# Patient Record
Sex: Male | Born: 1955 | Race: White | Hispanic: No | Marital: Married | Smoking: Former smoker
Health system: Southern US, Community
[De-identification: ages and names within clinical notes are randomized; demographics above are authoritative.]

## PROBLEM LIST (undated history)

## (undated) DIAGNOSIS — N183 Chronic kidney disease, stage 3 unspecified: Secondary | ICD-10-CM

## (undated) DIAGNOSIS — C329 Malignant neoplasm of larynx, unspecified: Secondary | ICD-10-CM

## (undated) DIAGNOSIS — I10 Essential (primary) hypertension: Secondary | ICD-10-CM

## (undated) DIAGNOSIS — F0781 Postconcussional syndrome: Secondary | ICD-10-CM

## (undated) DIAGNOSIS — G4733 Obstructive sleep apnea (adult) (pediatric): Secondary | ICD-10-CM

## (undated) DIAGNOSIS — E039 Hypothyroidism, unspecified: Secondary | ICD-10-CM

## (undated) DIAGNOSIS — Z9989 Dependence on other enabling machines and devices: Secondary | ICD-10-CM

## (undated) HISTORY — PX: TONSILLECTOMY: SUR1361

---

## 2006-02-19 HISTORY — PX: LARYNX SURGERY: SHX692

## 2006-07-08 ENCOUNTER — Emergency Department (HOSPITAL_COMMUNITY): Admission: EM | Admit: 2006-07-08 | Discharge: 2006-07-08 | Payer: Self-pay | Admitting: Emergency Medicine

## 2014-07-29 ENCOUNTER — Inpatient Hospital Stay (HOSPITAL_COMMUNITY)
Admission: EM | Admit: 2014-07-29 | Discharge: 2014-08-09 | DRG: 102 | Disposition: A | Discharge status: Home Health | Payer: BLUE CROSS/BLUE SHIELD | Attending: Family Medicine | Admitting: Family Medicine

## 2014-07-29 ENCOUNTER — Emergency Department (HOSPITAL_COMMUNITY): Payer: BLUE CROSS/BLUE SHIELD

## 2014-07-29 ENCOUNTER — Encounter (HOSPITAL_COMMUNITY): Payer: Self-pay | Admitting: Emergency Medicine

## 2014-07-29 DIAGNOSIS — N183 Chronic kidney disease, stage 3 unspecified: Secondary | ICD-10-CM

## 2014-07-29 DIAGNOSIS — R740 Nonspecific elevation of levels of transaminase and lactic acid dehydrogenase [LDH]: Secondary | ICD-10-CM | POA: Diagnosis present

## 2014-07-29 DIAGNOSIS — E860 Dehydration: Secondary | ICD-10-CM | POA: Diagnosis present

## 2014-07-29 DIAGNOSIS — Z808 Family history of malignant neoplasm of other organs or systems: Secondary | ICD-10-CM

## 2014-07-29 DIAGNOSIS — R4182 Altered mental status, unspecified: Secondary | ICD-10-CM

## 2014-07-29 DIAGNOSIS — E512 Wernicke's encephalopathy: Secondary | ICD-10-CM | POA: Diagnosis present

## 2014-07-29 DIAGNOSIS — F0781 Postconcussional syndrome: Secondary | ICD-10-CM | POA: Diagnosis not present

## 2014-07-29 DIAGNOSIS — R509 Fever, unspecified: Secondary | ICD-10-CM | POA: Insufficient documentation

## 2014-07-29 DIAGNOSIS — R5383 Other fatigue: Secondary | ICD-10-CM | POA: Insufficient documentation

## 2014-07-29 DIAGNOSIS — R7401 Elevation of levels of liver transaminase levels: Secondary | ICD-10-CM

## 2014-07-29 DIAGNOSIS — I129 Hypertensive chronic kidney disease with stage 1 through stage 4 chronic kidney disease, or unspecified chronic kidney disease: Secondary | ICD-10-CM | POA: Diagnosis present

## 2014-07-29 DIAGNOSIS — Z87891 Personal history of nicotine dependence: Secondary | ICD-10-CM

## 2014-07-29 DIAGNOSIS — E876 Hypokalemia: Secondary | ICD-10-CM | POA: Diagnosis not present

## 2014-07-29 DIAGNOSIS — Z8521 Personal history of malignant neoplasm of larynx: Secondary | ICD-10-CM

## 2014-07-29 DIAGNOSIS — N179 Acute kidney failure, unspecified: Secondary | ICD-10-CM | POA: Diagnosis present

## 2014-07-29 DIAGNOSIS — D539 Nutritional anemia, unspecified: Secondary | ICD-10-CM | POA: Diagnosis present

## 2014-07-29 DIAGNOSIS — D696 Thrombocytopenia, unspecified: Secondary | ICD-10-CM | POA: Diagnosis present

## 2014-07-29 DIAGNOSIS — Z880 Allergy status to penicillin: Secondary | ICD-10-CM

## 2014-07-29 DIAGNOSIS — E222 Syndrome of inappropriate secretion of antidiuretic hormone: Secondary | ICD-10-CM | POA: Diagnosis present

## 2014-07-29 DIAGNOSIS — N390 Urinary tract infection, site not specified: Secondary | ICD-10-CM | POA: Diagnosis present

## 2014-07-29 DIAGNOSIS — F329 Major depressive disorder, single episode, unspecified: Secondary | ICD-10-CM | POA: Diagnosis present

## 2014-07-29 DIAGNOSIS — F10231 Alcohol dependence with withdrawal delirium: Secondary | ICD-10-CM | POA: Diagnosis present

## 2014-07-29 DIAGNOSIS — Z923 Personal history of irradiation: Secondary | ICD-10-CM

## 2014-07-29 DIAGNOSIS — Y95 Nosocomial condition: Secondary | ICD-10-CM | POA: Insufficient documentation

## 2014-07-29 DIAGNOSIS — J189 Pneumonia, unspecified organism: Secondary | ICD-10-CM | POA: Diagnosis present

## 2014-07-29 DIAGNOSIS — E039 Hypothyroidism, unspecified: Secondary | ICD-10-CM | POA: Diagnosis present

## 2014-07-29 DIAGNOSIS — Z79899 Other long term (current) drug therapy: Secondary | ICD-10-CM

## 2014-07-29 DIAGNOSIS — E871 Hypo-osmolality and hyponatremia: Secondary | ICD-10-CM | POA: Insufficient documentation

## 2014-07-29 DIAGNOSIS — Z9221 Personal history of antineoplastic chemotherapy: Secondary | ICD-10-CM

## 2014-07-29 DIAGNOSIS — G4733 Obstructive sleep apnea (adult) (pediatric): Secondary | ICD-10-CM | POA: Diagnosis present

## 2014-07-29 HISTORY — DX: Postconcussional syndrome: F07.81

## 2014-07-29 HISTORY — DX: Essential (primary) hypertension: I10

## 2014-07-29 HISTORY — DX: Dependence on other enabling machines and devices: Z99.89

## 2014-07-29 HISTORY — DX: Chronic kidney disease, stage 3 unspecified: N18.30

## 2014-07-29 HISTORY — DX: Chronic kidney disease, stage 3 (moderate): N18.3

## 2014-07-29 HISTORY — DX: Hypothyroidism, unspecified: E03.9

## 2014-07-29 HISTORY — DX: Malignant neoplasm of larynx, unspecified: C32.9

## 2014-07-29 HISTORY — DX: Obstructive sleep apnea (adult) (pediatric): G47.33

## 2014-07-29 LAB — URINALYSIS, ROUTINE W REFLEX MICROSCOPIC
GLUCOSE, UA: NEGATIVE mg/dL
Hgb urine dipstick: NEGATIVE
KETONES UR: NEGATIVE mg/dL
Leukocytes, UA: NEGATIVE
NITRITE: NEGATIVE
PROTEIN: NEGATIVE mg/dL
SPECIFIC GRAVITY, URINE: 1.018 (ref 1.005–1.030)
UROBILINOGEN UA: 1 mg/dL (ref 0.0–1.0)
pH: 5 (ref 5.0–8.0)

## 2014-07-29 LAB — BASIC METABOLIC PANEL
ANION GAP: 14 (ref 5–15)
BUN: 49 mg/dL — ABNORMAL HIGH (ref 6–20)
CO2: 23 mmol/L (ref 22–32)
CREATININE: 3.17 mg/dL — AB (ref 0.61–1.24)
Calcium: 7.6 mg/dL — ABNORMAL LOW (ref 8.9–10.3)
Chloride: 97 mmol/L — ABNORMAL LOW (ref 101–111)
GFR calc Af Amer: 23 mL/min — ABNORMAL LOW (ref >60)
GFR, EST NON AFRICAN AMERICAN: 20 mL/min — AB (ref >60)
Glucose, Bld: 92 mg/dL (ref 65–99)
POTASSIUM: 2.9 mmol/L — AB (ref 3.5–5.1)
Sodium: 134 mmol/L — ABNORMAL LOW (ref 135–145)

## 2014-07-29 LAB — CBC
HEMATOCRIT: 33 % — AB (ref 39.0–52.0)
Hemoglobin: 11.2 g/dL — ABNORMAL LOW (ref 13.0–17.0)
MCH: 34.8 pg — AB (ref 26.0–34.0)
MCHC: 33.9 g/dL (ref 30.0–36.0)
MCV: 102.5 fL — ABNORMAL HIGH (ref 78.0–100.0)
Platelets: 149 10*3/uL — ABNORMAL LOW (ref 150–400)
RBC: 3.22 MIL/uL — AB (ref 4.22–5.81)
RDW: 14.9 % (ref 11.5–15.5)
WBC: 5.3 10*3/uL (ref 4.0–10.5)

## 2014-07-29 LAB — MAGNESIUM: MAGNESIUM: 1.3 mg/dL — AB (ref 1.7–2.4)

## 2014-07-29 MED ORDER — ONDANSETRON HCL 4 MG/2ML IJ SOLN
4.0000 mg | Freq: Four times a day (QID) | INTRAMUSCULAR | Status: DC | PRN
  Administered 2014-07-29 – 2014-08-05 (×3): 4 mg via INTRAVENOUS
  Filled 2014-07-29 (×3): qty 2

## 2014-07-29 MED ORDER — SODIUM CHLORIDE 0.9 % IV BOLUS (SEPSIS)
1000.0000 mL | Freq: Once | INTRAVENOUS | Status: AC
  Administered 2014-07-29: 1000 mL via INTRAVENOUS

## 2014-07-29 MED ORDER — ONDANSETRON HCL 4 MG PO TABS
4.0000 mg | ORAL_TABLET | Freq: Four times a day (QID) | ORAL | Status: DC | PRN
  Administered 2014-08-01: 4 mg via ORAL
  Filled 2014-07-29: qty 1

## 2014-07-29 MED ORDER — POTASSIUM CHLORIDE 10 MEQ/100ML IV SOLN
10.0000 meq | Freq: Once | INTRAVENOUS | Status: AC
  Administered 2014-07-29: 10 meq via INTRAVENOUS
  Filled 2014-07-29: qty 100

## 2014-07-29 MED ORDER — HEPARIN SODIUM (PORCINE) 5000 UNIT/ML IJ SOLN
5000.0000 [IU] | Freq: Three times a day (TID) | INTRAMUSCULAR | Status: DC
  Administered 2014-07-29 – 2014-08-03 (×16): 5000 [IU] via SUBCUTANEOUS
  Filled 2014-07-29 (×15): qty 1

## 2014-07-29 MED ORDER — BUPROPION HCL ER (SR) 150 MG PO TB12
150.0000 mg | ORAL_TABLET | Freq: Two times a day (BID) | ORAL | Status: DC
  Administered 2014-07-29: 150 mg via ORAL
  Filled 2014-07-29: qty 1

## 2014-07-29 MED ORDER — ACETAMINOPHEN 500 MG PO TABS
1000.0000 mg | ORAL_TABLET | Freq: Four times a day (QID) | ORAL | Status: DC | PRN
  Administered 2014-07-29: 1000 mg via ORAL
  Filled 2014-07-29: qty 2

## 2014-07-29 MED ORDER — MAGNESIUM SULFATE 2 GM/50ML IV SOLN
2.0000 g | Freq: Once | INTRAVENOUS | Status: AC
  Administered 2014-07-29: 2 g via INTRAVENOUS
  Filled 2014-07-29: qty 50

## 2014-07-29 MED ORDER — METOPROLOL SUCCINATE ER 50 MG PO TB24
50.0000 mg | ORAL_TABLET | Freq: Every day | ORAL | Status: DC
  Administered 2014-07-30 – 2014-08-09 (×11): 50 mg via ORAL
  Filled 2014-07-29 (×12): qty 1

## 2014-07-29 MED ORDER — OXYCODONE HCL 5 MG PO TABS
5.0000 mg | ORAL_TABLET | ORAL | Status: DC | PRN
  Administered 2014-07-29 – 2014-07-30 (×2): 5 mg via ORAL
  Filled 2014-07-29 (×2): qty 1

## 2014-07-29 MED ORDER — DIAZEPAM 5 MG PO TABS
5.0000 mg | ORAL_TABLET | Freq: Four times a day (QID) | ORAL | Status: DC | PRN
  Administered 2014-07-31: 5 mg via ORAL
  Filled 2014-07-29: qty 1

## 2014-07-29 MED ORDER — METOPROLOL TARTRATE 50 MG PO TABS: 50.0000 mg | ORAL_TABLET | Freq: Every day | ORAL | Status: DC

## 2014-07-29 MED ORDER — LEVOTHYROXINE SODIUM 50 MCG PO TABS
50.0000 ug | ORAL_TABLET | Freq: Every day | ORAL | Status: DC
  Administered 2014-07-30 – 2014-08-09 (×11): 50 ug via ORAL
  Filled 2014-07-29 (×9): qty 1
  Filled 2014-07-29: qty 2
  Filled 2014-07-29: qty 1

## 2014-07-29 MED ORDER — SODIUM CHLORIDE 0.9 % IV SOLN
INTRAVENOUS | Status: DC
  Administered 2014-07-29 – 2014-07-31 (×4): via INTRAVENOUS

## 2014-07-29 MED ORDER — POTASSIUM CHLORIDE CRYS ER 20 MEQ PO TBCR
40.0000 meq | EXTENDED_RELEASE_TABLET | Freq: Once | ORAL | Status: AC
  Administered 2014-07-29: 40 meq via ORAL
  Filled 2014-07-29: qty 2
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
## 2014-07-29 NOTE — Progress Notes (Signed)
RT entered room to set-up CPAP and the patient stated that he didn't want to wear the CPAP tonight. RT advised patient that if he changed his mind that he could have respiratory notified. RN aware. RT will continue to monitor.
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
## 2014-07-30 ENCOUNTER — Observation Stay (HOSPITAL_COMMUNITY): Payer: BLUE CROSS/BLUE SHIELD

## 2014-07-30 DIAGNOSIS — F329 Major depressive disorder, single episode, unspecified: Secondary | ICD-10-CM | POA: Diagnosis present

## 2014-07-30 DIAGNOSIS — R410 Disorientation, unspecified: Secondary | ICD-10-CM | POA: Diagnosis not present

## 2014-07-30 DIAGNOSIS — R7401 Elevation of levels of liver transaminase levels: Secondary | ICD-10-CM | POA: Insufficient documentation

## 2014-07-30 DIAGNOSIS — E222 Syndrome of inappropriate secretion of antidiuretic hormone: Secondary | ICD-10-CM | POA: Diagnosis present

## 2014-07-30 DIAGNOSIS — E860 Dehydration: Secondary | ICD-10-CM | POA: Diagnosis present

## 2014-07-30 DIAGNOSIS — R740 Nonspecific elevation of levels of transaminase and lactic acid dehydrogenase [LDH]: Secondary | ICD-10-CM

## 2014-07-30 DIAGNOSIS — E039 Hypothyroidism, unspecified: Secondary | ICD-10-CM | POA: Diagnosis present

## 2014-07-30 DIAGNOSIS — Z808 Family history of malignant neoplasm of other organs or systems: Secondary | ICD-10-CM | POA: Diagnosis not present

## 2014-07-30 DIAGNOSIS — D539 Nutritional anemia, unspecified: Secondary | ICD-10-CM | POA: Diagnosis present

## 2014-07-30 DIAGNOSIS — Z880 Allergy status to penicillin: Secondary | ICD-10-CM | POA: Diagnosis not present

## 2014-07-30 DIAGNOSIS — I129 Hypertensive chronic kidney disease with stage 1 through stage 4 chronic kidney disease, or unspecified chronic kidney disease: Secondary | ICD-10-CM | POA: Diagnosis present

## 2014-07-30 DIAGNOSIS — N179 Acute kidney failure, unspecified: Secondary | ICD-10-CM | POA: Diagnosis present

## 2014-07-30 DIAGNOSIS — R5383 Other fatigue: Secondary | ICD-10-CM | POA: Diagnosis present

## 2014-07-30 DIAGNOSIS — R4182 Altered mental status, unspecified: Secondary | ICD-10-CM | POA: Diagnosis not present

## 2014-07-30 DIAGNOSIS — E512 Wernicke's encephalopathy: Secondary | ICD-10-CM | POA: Diagnosis present

## 2014-07-30 DIAGNOSIS — G4733 Obstructive sleep apnea (adult) (pediatric): Secondary | ICD-10-CM

## 2014-07-30 DIAGNOSIS — Z9221 Personal history of antineoplastic chemotherapy: Secondary | ICD-10-CM | POA: Diagnosis not present

## 2014-07-30 DIAGNOSIS — F0781 Postconcussional syndrome: Secondary | ICD-10-CM | POA: Diagnosis present

## 2014-07-30 DIAGNOSIS — E876 Hypokalemia: Secondary | ICD-10-CM | POA: Diagnosis present

## 2014-07-30 DIAGNOSIS — N183 Chronic kidney disease, stage 3 (moderate): Secondary | ICD-10-CM | POA: Diagnosis present

## 2014-07-30 DIAGNOSIS — Z79899 Other long term (current) drug therapy: Secondary | ICD-10-CM | POA: Diagnosis not present

## 2014-07-30 DIAGNOSIS — D696 Thrombocytopenia, unspecified: Secondary | ICD-10-CM | POA: Diagnosis present

## 2014-07-30 DIAGNOSIS — Z8521 Personal history of malignant neoplasm of larynx: Secondary | ICD-10-CM | POA: Diagnosis not present

## 2014-07-30 DIAGNOSIS — N390 Urinary tract infection, site not specified: Secondary | ICD-10-CM | POA: Diagnosis present

## 2014-07-30 DIAGNOSIS — E871 Hypo-osmolality and hyponatremia: Secondary | ICD-10-CM | POA: Diagnosis not present

## 2014-07-30 DIAGNOSIS — Z923 Personal history of irradiation: Secondary | ICD-10-CM | POA: Diagnosis not present

## 2014-07-30 DIAGNOSIS — Z87891 Personal history of nicotine dependence: Secondary | ICD-10-CM | POA: Diagnosis not present

## 2014-07-30 DIAGNOSIS — F10231 Alcohol dependence with withdrawal delirium: Secondary | ICD-10-CM | POA: Diagnosis present

## 2014-07-30 DIAGNOSIS — Y95 Nosocomial condition: Secondary | ICD-10-CM | POA: Diagnosis present

## 2014-07-30 DIAGNOSIS — J189 Pneumonia, unspecified organism: Secondary | ICD-10-CM | POA: Diagnosis present

## 2014-07-30 LAB — CBC
HCT: 33.1 % — ABNORMAL LOW (ref 39.0–52.0)
Hemoglobin: 11.3 g/dL — ABNORMAL LOW (ref 13.0–17.0)
MCH: 34.9 pg — ABNORMAL HIGH (ref 26.0–34.0)
MCHC: 34.1 g/dL (ref 30.0–36.0)
MCV: 102.2 fL — ABNORMAL HIGH (ref 78.0–100.0)
Platelets: 151 10*3/uL (ref 150–400)
RBC: 3.24 MIL/uL — ABNORMAL LOW (ref 4.22–5.81)
RDW: 15.1 % (ref 11.5–15.5)
WBC: 4.5 10*3/uL (ref 4.0–10.5)

## 2014-07-30 LAB — COMPREHENSIVE METABOLIC PANEL
ALT: 653 U/L — AB (ref 17–63)
AST: 232 U/L — AB (ref 15–41)
Albumin: 2.8 g/dL — ABNORMAL LOW (ref 3.5–5.0)
Alkaline Phosphatase: 313 U/L — ABNORMAL HIGH (ref 38–126)
Anion gap: 10 (ref 5–15)
BUN: 31 mg/dL — ABNORMAL HIGH (ref 6–20)
CO2: 24 mmol/L (ref 22–32)
CREATININE: 2.04 mg/dL — AB (ref 0.61–1.24)
Calcium: 8.4 mg/dL — ABNORMAL LOW (ref 8.9–10.3)
Chloride: 106 mmol/L (ref 101–111)
GFR calc non Af Amer: 34 mL/min — ABNORMAL LOW (ref >60)
GFR, EST AFRICAN AMERICAN: 39 mL/min — AB (ref >60)
Glucose, Bld: 90 mg/dL (ref 65–99)
Potassium: 3.3 mmol/L — ABNORMAL LOW (ref 3.5–5.1)
Sodium: 140 mmol/L (ref 135–145)
TOTAL PROTEIN: 5.1 g/dL — AB (ref 6.5–8.1)
Total Bilirubin: 2.3 mg/dL — ABNORMAL HIGH (ref 0.3–1.2)

## 2014-07-30 LAB — MAGNESIUM: MAGNESIUM: 1.9 mg/dL (ref 1.7–2.4)

## 2014-07-30 LAB — CK: CK TOTAL: 104 U/L (ref 49–397)

## 2014-07-30 LAB — VITAMIN B12: Vitamin B-12: 1873 pg/mL — ABNORMAL HIGH (ref 180–914)

## 2014-07-30 LAB — TSH: TSH: 1.704 u[IU]/mL (ref 0.350–4.500)

## 2014-07-30 LAB — APTT: aPTT: 36 seconds (ref 24–37)

## 2014-07-30 LAB — PROTIME-INR
INR: 1.26 (ref 0.00–1.49)
PROTHROMBIN TIME: 15.9 s — AB (ref 11.6–15.2)

## 2014-07-30 LAB — FOLATE: Folate: 17.2 ng/mL (ref >5.9)

## 2014-07-30 LAB — PHOSPHORUS: Phosphorus: 2.2 mg/dL — ABNORMAL LOW (ref 2.5–4.6)

## 2014-07-30 MED ORDER — VITAMIN B-1 100 MG PO TABS
100.0000 mg | ORAL_TABLET | Freq: Every day | ORAL | Status: DC
  Administered 2014-07-30 – 2014-08-03 (×5): 100 mg via ORAL
  Filled 2014-07-30 (×5): qty 1

## 2014-07-30 MED ORDER — BUPROPION HCL ER (SR) 150 MG PO TB12
150.0000 mg | ORAL_TABLET | Freq: Two times a day (BID) | ORAL | Status: DC
  Administered 2014-07-30 – 2014-08-09 (×21): 150 mg via ORAL
  Filled 2014-07-30 (×21): qty 1

## 2014-07-30 MED ORDER — BUTALBITAL-APAP-CAFFEINE 50-325-40 MG PO TABS
1.0000 | ORAL_TABLET | Freq: Once | ORAL | Status: AC
  Administered 2014-07-30: 1 via ORAL
  Filled 2014-07-30: qty 1

## 2014-07-30 MED ORDER — LORAZEPAM 2 MG/ML IJ SOLN: 1.0000 mg | Freq: Four times a day (QID) | INTRAMUSCULAR | Status: DC | PRN

## 2014-07-30 MED ORDER — CALCIUM CARBONATE ANTACID 500 MG PO CHEW
400.0000 mg | CHEWABLE_TABLET | Freq: Once | ORAL | Status: AC
  Administered 2014-07-30: 400 mg via ORAL
  Filled 2014-07-30: qty 2

## 2014-07-30 MED ORDER — FOLIC ACID 1 MG PO TABS
1.0000 mg | ORAL_TABLET | Freq: Every day | ORAL | Status: DC
  Administered 2014-07-30 – 2014-08-09 (×11): 1 mg via ORAL
  Filled 2014-07-30 (×11): qty 1

## 2014-07-30 MED ORDER — LORAZEPAM 1 MG PO TABS
1.0000 mg | ORAL_TABLET | Freq: Four times a day (QID) | ORAL | Status: DC | PRN
  Administered 2014-08-01 – 2014-08-02 (×5): 1 mg via ORAL
  Filled 2014-07-30 (×5): qty 1

## 2014-07-30 MED ORDER — THIAMINE HCL 100 MG/ML IJ SOLN: 100.0000 mg | Freq: Every day | INTRAMUSCULAR | Status: DC

## 2014-07-30 MED ORDER — ADULT MULTIVITAMIN W/MINERALS CH
1.0000 | ORAL_TABLET | Freq: Every day | ORAL | Status: DC
  Administered 2014-07-30 – 2014-08-09 (×11): 1 via ORAL
  Filled 2014-07-30 (×11): qty 1

## 2014-07-30 MED ORDER — POTASSIUM CHLORIDE CRYS ER 20 MEQ PO TBCR
20.0000 meq | EXTENDED_RELEASE_TABLET | Freq: Once | ORAL | Status: AC
  Administered 2014-07-30: 20 meq via ORAL
  Filled 2014-07-30: qty 1

## 2014-07-30 NOTE — Progress Notes (Signed)
Patient continues to refuse CPAP therapy.
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
## 2014-07-31 LAB — BILIRUBIN, FRACTIONATED(TOT/DIR/INDIR)
Bilirubin, Direct: <0.1 mg/dL — ABNORMAL LOW (ref 0.1–0.5)
Total Bilirubin: 0.6 mg/dL (ref 0.3–1.2)

## 2014-07-31 LAB — COMPREHENSIVE METABOLIC PANEL WITH GFR
ALT: 526 U/L — ABNORMAL HIGH (ref 17–63)
AST: 150 U/L — ABNORMAL HIGH (ref 15–41)
Albumin: 3.3 g/dL — ABNORMAL LOW (ref 3.5–5.0)
Alkaline Phosphatase: 382 U/L — ABNORMAL HIGH (ref 38–126)
Anion gap: 14 (ref 5–15)
BUN: 13 mg/dL (ref 6–20)
CO2: 30 mmol/L (ref 22–32)
Calcium: 9.1 mg/dL (ref 8.9–10.3)
Chloride: 94 mmol/L — ABNORMAL LOW (ref 101–111)
Creatinine, Ser: 1.07 mg/dL (ref 0.61–1.24)
GFR calc Af Amer: >60 mL/min
GFR calc non Af Amer: >60 mL/min
Glucose, Bld: 90 mg/dL (ref 65–99)
Potassium: 3.3 mmol/L — ABNORMAL LOW (ref 3.5–5.1)
Sodium: 138 mmol/L (ref 135–145)
Total Bilirubin: 3.3 mg/dL — ABNORMAL HIGH (ref 0.3–1.2)
Total Protein: 6 g/dL — ABNORMAL LOW (ref 6.5–8.1)

## 2014-07-31 LAB — HEPATITIS PANEL, ACUTE
HEP B S AG: NEGATIVE
Hep A IgM: NEGATIVE
Hep B C IgM: NEGATIVE

## 2014-07-31 LAB — CBC
HCT: 37.4 % — ABNORMAL LOW (ref 39.0–52.0)
Hemoglobin: 12.6 g/dL — ABNORMAL LOW (ref 13.0–17.0)
MCH: 34.8 pg — ABNORMAL HIGH (ref 26.0–34.0)
MCHC: 33.7 g/dL (ref 30.0–36.0)
MCV: 103.3 fL — ABNORMAL HIGH (ref 78.0–100.0)
Platelets: 185 K/uL (ref 150–400)
RBC: 3.62 MIL/uL — ABNORMAL LOW (ref 4.22–5.81)
RDW: 15.3 % (ref 11.5–15.5)
WBC: 8.5 K/uL (ref 4.0–10.5)

## 2014-07-31 LAB — GAMMA GT: GGT: 14 U/L (ref 7–50)

## 2014-07-31 LAB — AMMONIA: Ammonia: 55 umol/L — ABNORMAL HIGH (ref 9–35)

## 2014-07-31 MED ORDER — OXYCODONE HCL 5 MG PO TABS
5.0000 mg | ORAL_TABLET | Freq: Once | ORAL | Status: AC
Start: 2014-07-31 — End: 2014-07-31
  Administered 2014-07-31: 5 mg via ORAL
  Filled 2014-07-31: qty 1

## 2014-07-31 MED ORDER — POTASSIUM CHLORIDE CRYS ER 20 MEQ PO TBCR
20.0000 meq | EXTENDED_RELEASE_TABLET | Freq: Two times a day (BID) | ORAL | Status: AC
Start: 2014-07-31 — End: 2014-07-31
  Administered 2014-07-31 (×2): 20 meq via ORAL
  Filled 2014-07-31 (×2): qty 1
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
## 2014-07-31 NOTE — Progress Notes (Signed)
Patient continues to refuse CPAP 

## 2014-08-01 LAB — COMPREHENSIVE METABOLIC PANEL
ALT: 338 U/L — AB (ref 17–63)
AST: 111 U/L — AB (ref 15–41)
Albumin: 3.3 g/dL — ABNORMAL LOW (ref 3.5–5.0)
Alkaline Phosphatase: 368 U/L — ABNORMAL HIGH (ref 38–126)
Anion gap: 10 (ref 5–15)
BUN: 12 mg/dL (ref 6–20)
CHLORIDE: 91 mmol/L — AB (ref 101–111)
CO2: 30 mmol/L (ref 22–32)
Calcium: 9.3 mg/dL (ref 8.9–10.3)
Creatinine, Ser: 1.19 mg/dL (ref 0.61–1.24)
GFR calc non Af Amer: >60 mL/min (ref >60)
GLUCOSE: 107 mg/dL — AB (ref 65–99)
POTASSIUM: 3.7 mmol/L (ref 3.5–5.1)
Sodium: 131 mmol/L — ABNORMAL LOW (ref 135–145)
Total Bilirubin: 2.6 mg/dL — ABNORMAL HIGH (ref 0.3–1.2)
Total Protein: 5.9 g/dL — ABNORMAL LOW (ref 6.5–8.1)
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
## 2014-08-01 NOTE — Progress Notes (Signed)
Pt ambulated around the whole unit with the assistance of 2 and a rolling walker.  He is unsteady and veers from side to side but enjoys walking and moving.  He is impulsive and is a high fall risk.  Sat up in chair with chair alarm on.  Took a zofran for some slight nausea and ate a little breakfast.

## 2014-08-01 NOTE — Progress Notes (Signed)
Discussed my concerns with the Dr.

## 2014-08-01 NOTE — Progress Notes (Signed)
Called CN who called [facility] for a safety sitter, none available.  Called a friend of pt's, Harrie Jeans (who had left a note) and his wife and left messages to see if they could come up and sit with him.
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
## 2014-08-02 DIAGNOSIS — N183 Chronic kidney disease, stage 3 (moderate): Secondary | ICD-10-CM

## 2014-08-02 LAB — BASIC METABOLIC PANEL
Anion gap: 9 (ref 5–15)
BUN: 12 mg/dL (ref 6–20)
CO2: 29 mmol/L (ref 22–32)
CREATININE: 1.1 mg/dL (ref 0.61–1.24)
Calcium: 9.5 mg/dL (ref 8.9–10.3)
Chloride: 92 mmol/L — ABNORMAL LOW (ref 101–111)
Glucose, Bld: 96 mg/dL (ref 65–99)
Potassium: 3.7 mmol/L (ref 3.5–5.1)
Sodium: 130 mmol/L — ABNORMAL LOW (ref 135–145)

## 2014-08-02 MED ORDER — IBUPROFEN 600 MG PO TABS
600.0000 mg | ORAL_TABLET | Freq: Once | ORAL | Status: AC
  Administered 2014-08-02: 600 mg via ORAL
  Filled 2014-08-02: qty 1

## 2014-08-02 MED ORDER — SODIUM CHLORIDE 0.9 % IV SOLN
INTRAVENOUS | Status: DC
  Administered 2014-08-02 – 2014-08-03 (×2): via INTRAVENOUS

## 2014-08-02 MED ORDER — LORAZEPAM 2 MG/ML IJ SOLN
1.0000 mg | Freq: Four times a day (QID) | INTRAMUSCULAR | Status: DC | PRN
  Administered 2014-08-04 – 2014-08-05 (×2): 1 mg via INTRAVENOUS
  Filled 2014-08-02 (×2): qty 1

## 2014-08-02 MED ORDER — LORAZEPAM 1 MG PO TABS
1.0000 mg | ORAL_TABLET | Freq: Four times a day (QID) | ORAL | Status: DC | PRN
  Administered 2014-08-04: 1 mg via ORAL
  Filled 2014-08-02: qty 1

## 2014-08-02 NOTE — Progress Notes (Signed)
Patient refuses CPAP 
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
## 2014-08-02 NOTE — Clinical Social Work Note (Signed)
CSW received consult for SNF placement, did not have time to assess will complete in the morning.  Jones Broom. Millen, MSW, [place] 08/02/2014 5:22 PM
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
## 2014-08-02 NOTE — Progress Notes (Signed)
Pt states he wears a CPAP at home when he feels like it but does not wish to wear one here. RT will continue to monitor.

## 2014-08-03 DIAGNOSIS — R740 Nonspecific elevation of levels of transaminase and lactic acid dehydrogenase [LDH]: Secondary | ICD-10-CM

## 2014-08-03 DIAGNOSIS — Z8521 Personal history of malignant neoplasm of larynx: Secondary | ICD-10-CM

## 2014-08-03 DIAGNOSIS — D539 Nutritional anemia, unspecified: Secondary | ICD-10-CM

## 2014-08-03 LAB — BASIC METABOLIC PANEL
Anion gap: 10 (ref 5–15)
BUN: 9 mg/dL (ref 6–20)
CHLORIDE: 91 mmol/L — AB (ref 101–111)
CO2: 28 mmol/L (ref 22–32)
Calcium: 9.1 mg/dL (ref 8.9–10.3)
Creatinine, Ser: 0.88 mg/dL (ref 0.61–1.24)
Glucose, Bld: 110 mg/dL — ABNORMAL HIGH (ref 65–99)
POTASSIUM: 3.6 mmol/L (ref 3.5–5.1)
Sodium: 129 mmol/L — ABNORMAL LOW (ref 135–145)

## 2014-08-03 LAB — HEPATIC FUNCTION PANEL
ALT: 221 U/L — AB (ref 17–63)
AST: 79 U/L — AB (ref 15–41)
Albumin: 3.1 g/dL — ABNORMAL LOW (ref 3.5–5.0)
Alkaline Phosphatase: 318 U/L — ABNORMAL HIGH (ref 38–126)
BILIRUBIN DIRECT: 0.9 mg/dL — AB (ref 0.1–0.5)
BILIRUBIN INDIRECT: 1 mg/dL — AB (ref 0.3–0.9)
Total Bilirubin: 1.9 mg/dL — ABNORMAL HIGH (ref 0.3–1.2)
Total Protein: 5.9 g/dL — ABNORMAL LOW (ref 6.5–8.1)

## 2014-08-03 LAB — SODIUM, URINE, RANDOM: Sodium, Ur: 89 mmol/L

## 2014-08-03 LAB — CREATININE, URINE, RANDOM: Creatinine, Urine: 89.04 mg/dL

## 2014-08-03 LAB — OSMOLALITY: Osmolality: 270 mOsm/kg — ABNORMAL LOW (ref 275–300)

## 2014-08-03 NOTE — Clinical Social Work Note (Addendum)
CSW received referral for SNF placement.  CSW met with patient to discuss going to SNF for short term rehab, patient agreed to going.  CSW informed patient that since he has [employer], the options are limited on where he can go.  CSW informed patient about locations, patient states he lives near [place], and is open to going to a SNF, in [place].  CSW to continue following patient and try to find SNF placement.  Formal assessment to follow.  2:50pm, CSW received bed offers for patient at [facility], [place] spoke to SNF and informed them that patient is in agreement to going to a SNF in [place].  SNF is going to start insurance authorization approval.  CSW to continue to follow patient's discharge planning.  FL2 on chart physician please sign.  Jones Broom. Archbold, MSW, [place] 08/03/2014 12:16 PM

## 2014-08-03 NOTE — Progress Notes (Signed)
Patient refused CPAP for the night. Patient stated he will bring his from home.

## 2014-08-03 NOTE — Clinical Social Work Note (Signed)
Clinical Social Work Assessment  Patient Details  Name: Jack Perez MRN: [id number] Date of Birth: 1955-03-10  Date of referral:  08/03/14               Reason for consult:  Facility Placement                Permission sought to share information with:    Permission granted to share information::  Yes, Verbal Permission Granted  Name::        Agency::  SNF admissions  Relationship::     Contact Information:     Housing/Transportation Living arrangements for the past 2 months:  Single Family Home Source of Information:  Patient Patient Interpreter Needed:  None Criminal Activity/Legal Involvement Pertinent to Current Situation/Hospitalization:  No - Comment as needed Significant Relationships:  Adult Children Lives with:  Self Do you feel safe going back to the place where you live?  No (Patient agrees he needs to go to SNF for short term rehab to get his strength up in order to return back home.) Need for family participation in patient care:   Care giving concerns:  Patient lives on his own and feels like he needs some PT at a SNF in order to become safe again to return back home.   Social Worker assessment / plan:  CSW met with patient to discuss SNF process for short term rehab.  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  Patient states he lives by himself, near [facility] and would like to return back there once he has his strength back up.  Patient expressed he was open to going to a SNF for short term rehab, and would like to go to a SNF in [place] if he can.  CSW discussed with patient that due to his insurance, he will have limited amount of options, patient stated he understands and just wants to get better.  Patient was alert and oriented x3, and able to converse with CSW.  Patient states he wants to get better soon so he can return back to work.  Patient did not have any other questions, CSW will continue to follow  patient's progress towards discharge.  Employment status:  Therapist, music:  Managed Care PT Recommendations:  [place] / Referral to community resources:     Patient/Family's Response to care:  Patient is in agreement to going to SNF for short term rehab.  Patient/Family's Understanding of and Emotional Response to Diagnosis, Current Treatment, and Prognosis:  Patient is aware of his weakness, but is motivated to improve his strength.  Emotional Assessment Appearance:  Appears older than stated age Attitude/Demeanor/Rapport:    Affect (typically observed):  Quiet, Stable, Calm Orientation:  Oriented to Self, Oriented to Place, Oriented to Situation Alcohol / Substance use:  Alcohol Use Psych involvement (Current and /or in the community):  No (Comment)  Discharge Needs  Concerns to be addressed:  Lack of Support, Substance Abuse Concerns (Patient's wife expressed that she is concerned about patient's alcohol use) Readmission within the last 30 days:  No Current discharge risk:  Lack of support system Barriers to Discharge:  Requiring sitter/restraints, Insurance Authorization   Anell Barr 08/03/2014, 4:03 PM
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
## 2014-08-03 NOTE — Progress Notes (Signed)
FPTS Interim Progress Note   I spoke to the patient's wife Saulo Anthis earlier today. Cell phone [phone]. She reports that she is very concerned about his alcohol intake. He use to be a weekend drinker but recently has started drinking himself into a stupor all days a week. He is distance himself from his family and friends. He told his possible that his wife had died last year and even that time off for this even though she did not die. She reports that some of his lives are so bad that he believes them himself. She would like to know about resources for alcohol cessation after discharge. She would also like regular updates on his condition.   Virginia Crews, MD 08/03/2014, 11:02 PM PGY-1, [place] Medicine Service pager [phone]

## 2014-08-04 ENCOUNTER — Inpatient Hospital Stay (HOSPITAL_COMMUNITY): Payer: BLUE CROSS/BLUE SHIELD

## 2014-08-04 ENCOUNTER — Encounter (HOSPITAL_COMMUNITY): Payer: Self-pay | Admitting: Neurological Surgery

## 2014-08-04 DIAGNOSIS — F329 Major depressive disorder, single episode, unspecified: Secondary | ICD-10-CM

## 2014-08-04 DIAGNOSIS — J189 Pneumonia, unspecified organism: Secondary | ICD-10-CM

## 2014-08-04 DIAGNOSIS — R509 Fever, unspecified: Secondary | ICD-10-CM

## 2014-08-04 DIAGNOSIS — R4182 Altered mental status, unspecified: Secondary | ICD-10-CM

## 2014-08-04 DIAGNOSIS — E871 Hypo-osmolality and hyponatremia: Secondary | ICD-10-CM

## 2014-08-04 DIAGNOSIS — D696 Thrombocytopenia, unspecified: Secondary | ICD-10-CM

## 2014-08-04 DIAGNOSIS — R410 Disorientation, unspecified: Secondary | ICD-10-CM

## 2014-08-04 LAB — CBC
HEMATOCRIT: 36.7 % — AB (ref 39.0–52.0)
Hemoglobin: 12.6 g/dL — ABNORMAL LOW (ref 13.0–17.0)
MCH: 34.8 pg — AB (ref 26.0–34.0)
MCHC: 34.3 g/dL (ref 30.0–36.0)
MCV: 101.4 fL — ABNORMAL HIGH (ref 78.0–100.0)
Platelets: 200 10*3/uL (ref 150–400)
RBC: 3.62 MIL/uL — AB (ref 4.22–5.81)
RDW: 14.3 % (ref 11.5–15.5)
WBC: 8.7 10*3/uL (ref 4.0–10.5)

## 2014-08-04 LAB — OSMOLALITY, URINE: Osmolality, Ur: 427 mOsm/kg (ref 390–1090)

## 2014-08-04 LAB — COMPREHENSIVE METABOLIC PANEL
ALK PHOS: 294 U/L — AB (ref 38–126)
ALT: 183 U/L — AB (ref 17–63)
AST: 67 U/L — ABNORMAL HIGH (ref 15–41)
Albumin: 3.3 g/dL — ABNORMAL LOW (ref 3.5–5.0)
Anion gap: 10 (ref 5–15)
BILIRUBIN TOTAL: 1.9 mg/dL — AB (ref 0.3–1.2)
BUN: 7 mg/dL (ref 6–20)
CALCIUM: 9 mg/dL (ref 8.9–10.3)
CO2: 26 mmol/L (ref 22–32)
CREATININE: 0.88 mg/dL (ref 0.61–1.24)
Chloride: 89 mmol/L — ABNORMAL LOW (ref 101–111)
Glucose, Bld: 102 mg/dL — ABNORMAL HIGH (ref 65–99)
Potassium: 3.7 mmol/L (ref 3.5–5.1)
SODIUM: 125 mmol/L — AB (ref 135–145)
TOTAL PROTEIN: 6.2 g/dL — AB (ref 6.5–8.1)

## 2014-08-04 LAB — PROTIME-INR
INR: 0.92 (ref 0.00–1.49)
Prothrombin Time: 12.6 seconds (ref 11.6–15.2)

## 2014-08-04 LAB — AMMONIA: AMMONIA: 27 umol/L (ref 9–35)

## 2014-08-04 MED ORDER — LORAZEPAM 2 MG/ML IJ SOLN
1.0000 mg | Freq: Once | INTRAMUSCULAR | Status: AC
  Administered 2014-08-04: 1 mg via INTRAVENOUS
  Filled 2014-08-04: qty 1

## 2014-08-04 MED ORDER — IBUPROFEN 600 MG PO TABS
600.0000 mg | ORAL_TABLET | Freq: Once | ORAL | Status: AC
  Administered 2014-08-04: 600 mg via ORAL
  Filled 2014-08-04: qty 1

## 2014-08-04 MED ORDER — THIAMINE HCL 100 MG/ML IJ SOLN
500.0000 mg | Freq: Three times a day (TID) | INTRAVENOUS | Status: AC
  Administered 2014-08-04 – 2014-08-05 (×6): 500 mg via INTRAVENOUS
  Filled 2014-08-04 (×6): qty 5

## 2014-08-04 MED ORDER — THIAMINE HCL 100 MG/ML IJ SOLN
500.0000 mg | Freq: Every day | INTRAMUSCULAR | Status: DC
  Filled 2014-08-04: qty 5
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
## 2014-08-04 NOTE — Progress Notes (Signed)
Chaplain visited  As a follow up. Pt appeared weak. Sitter was in the room to ensure he would not get out of bed. Chaplain sat with the Pt and talked about his family. The Pt seem to enjoy someone just listening to him talk about his life. Chaplain listened and prayed with the Pt. The Pt told Chaplain he pray some good prayers. Chaplain will continue follow up.   08/04/14 1100  Clinical Encounter Type  Visited With Patient  Visit Type Follow-up  Referral From Chaplain  Spiritual Encounters  Spiritual Needs Emotional

## 2014-08-04 NOTE — Progress Notes (Signed)
CM consult to request records from PCP, as requested by psychiatrist.  Inappropriate consult--forwarded message to nursing secretary and bedside nurse.    Reinaldo Raddle, RN, BSN  Trauma/Neuro ICU Case Manager [phone]
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
## 2014-08-04 NOTE — Clinical Social Work Placement (Signed)
   CLINICAL SOCIAL WORK PLACEMENT  NOTE  Date:  08/04/2014  Patient Details  Name: Jack Perez MRN: [id number] Date of Birth: 1955/03/11  Clinical Social Work is seeking post-discharge placement for this patient at the [place] level of care (*CSW will initial, date and re-position this form in  chart as items are completed):  No   Patient/family provided with [place] Work Department's list of facilities offering this level of care within the geographic area requested by the patient (or if unable, by the patient's family).  Yes   Patient/family informed of their freedom to choose among providers that offer the needed level of care, that participate in Medicare, Medicaid or managed care program needed by the patient, have an available bed and are willing to accept the patient.  Yes   Patient/family informed of [place]'s ownership interest in [facility] and [facility], as well as of the fact that they are under no obligation to receive care at these facilities.  PASRR submitted to EDS on 08/04/14     PASRR number received on 08/04/14     Existing PASRR number confirmed on       FL2 transmitted to all facilities in geographic area requested by pt/family on       FL2 transmitted to all facilities within larger geographic area on       Patient informed that his/her managed care company has contracts with or will negotiate with certain facilities, including the following:        Yes   Patient/family informed of bed offers received.  Patient chooses bed at [facility]     Physician recommends and patient chooses bed at      Patient to be transferred to   on  .  Patient to be transferred to facility by       Patient family notified on   of transfer.  Name of family member notified:        PHYSICIAN Please sign FL2     Additional Comment:    _______________________________________________ Ross Ludwig, LCSWA 08/04/2014, 9:50 AM

## 2014-08-04 NOTE — Progress Notes (Signed)
CALL PAGER [phone] for any questions or notifications regarding this patient   FMTS Attending Note: Dorcas Mcmurray MD I have discussed this patient with the resident a regarding overnight change in mental status, subsequent CT head showing results below (see report). Seems there are two areas of SDH, one of which has some hygroma formation and thus is likely chronic, with a smaller area of possible acute bleed. I discussed with Dr. Ardelia Mems (Senior resident overnight) and I agree wit their plans to stop subq heparin, start SCDs. It is also unclear whether the relatively mild cognitive changes noted by Dr. Brita Romp overnight (which prompted the scan) are related to these SDH (possible expansion? They are relatively small). Or if MS changes more related to [facility] or other metabolic factors. We have decided to empirically start high dose thiamine protocol in case this is Wernickes, especially sonce no significant risk to that addition. I will discuss with Dr. Andria Frames (attending for week). Will consult neurosurgery --I suspect this will be conservative management but will be good to have them on board. Dorcas Mcmurray

## 2014-08-04 NOTE — Progress Notes (Signed)
Patient refused CPAP.  Patient aware to ask RN to call Respiratory if he changes his mind 
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
## 2014-08-04 NOTE — Progress Notes (Signed)
I asked the patient several times at each meal if he wanted to eat and he said later each time. I offered again at 1800 and he said he would eat later tonight.

## 2014-08-05 ENCOUNTER — Inpatient Hospital Stay (HOSPITAL_COMMUNITY): Payer: BLUE CROSS/BLUE SHIELD

## 2014-08-05 DIAGNOSIS — R509 Fever, unspecified: Secondary | ICD-10-CM | POA: Insufficient documentation

## 2014-08-05 LAB — URINE MICROSCOPIC-ADD ON

## 2014-08-05 LAB — COMPREHENSIVE METABOLIC PANEL
ALK PHOS: 292 U/L — AB (ref 38–126)
ALT: 148 U/L — ABNORMAL HIGH (ref 17–63)
ANION GAP: 12 (ref 5–15)
AST: 58 U/L — ABNORMAL HIGH (ref 15–41)
Albumin: 3.4 g/dL — ABNORMAL LOW (ref 3.5–5.0)
BILIRUBIN TOTAL: 2.8 mg/dL — AB (ref 0.3–1.2)
BUN: 10 mg/dL (ref 6–20)
CHLORIDE: 85 mmol/L — AB (ref 101–111)
CO2: 27 mmol/L (ref 22–32)
Calcium: 9.3 mg/dL (ref 8.9–10.3)
Creatinine, Ser: 1.18 mg/dL (ref 0.61–1.24)
GFR calc non Af Amer: >60 mL/min (ref >60)
GLUCOSE: 94 mg/dL (ref 65–99)
POTASSIUM: 4.3 mmol/L (ref 3.5–5.1)
Sodium: 124 mmol/L — ABNORMAL LOW (ref 135–145)
Total Protein: 6.5 g/dL (ref 6.5–8.1)

## 2014-08-05 LAB — CBC
HCT: 37.9 % — ABNORMAL LOW (ref 39.0–52.0)
Hemoglobin: 13.2 g/dL (ref 13.0–17.0)
MCH: 35.6 pg — AB (ref 26.0–34.0)
MCHC: 34.8 g/dL (ref 30.0–36.0)
MCV: 102.2 fL — ABNORMAL HIGH (ref 78.0–100.0)
PLATELETS: 247 10*3/uL (ref 150–400)
RBC: 3.71 MIL/uL — AB (ref 4.22–5.81)
RDW: 14 % (ref 11.5–15.5)
WBC: 23.9 10*3/uL — ABNORMAL HIGH (ref 4.0–10.5)

## 2014-08-05 LAB — URINALYSIS, ROUTINE W REFLEX MICROSCOPIC
Glucose, UA: NEGATIVE mg/dL
Ketones, ur: 15 mg/dL — AB
NITRITE: POSITIVE — AB
PH: 5.5 (ref 5.0–8.0)
Protein, ur: 100 mg/dL — AB
SPECIFIC GRAVITY, URINE: 1.02 (ref 1.005–1.030)
Urobilinogen, UA: 1 mg/dL (ref 0.0–1.0)

## 2014-08-05 MED ORDER — HALOPERIDOL 1 MG PO TABS
1.0000 mg | ORAL_TABLET | Freq: Every day | ORAL | Status: DC
  Administered 2014-08-05 – 2014-08-08 (×4): 1 mg via ORAL
  Filled 2014-08-05 (×4): qty 1

## 2014-08-05 MED ORDER — ACETAMINOPHEN 500 MG PO TABS
1000.0000 mg | ORAL_TABLET | Freq: Four times a day (QID) | ORAL | Status: DC | PRN
  Administered 2014-08-05 – 2014-08-09 (×10): 1000 mg via ORAL
  Filled 2014-08-05 (×11): qty 2

## 2014-08-05 MED ORDER — IBUPROFEN 600 MG PO TABS
600.0000 mg | ORAL_TABLET | Freq: Once | ORAL | Status: AC
  Administered 2014-08-05: 600 mg via ORAL
  Filled 2014-08-05: qty 1

## 2014-08-05 MED ORDER — LEVOFLOXACIN IN D5W 750 MG/150ML IV SOLN
750.0000 mg | INTRAVENOUS | Status: DC
  Administered 2014-08-05: 750 mg via INTRAVENOUS
  Filled 2014-08-05 (×2): qty 150

## 2014-08-05 NOTE — Progress Notes (Signed)
Patient is refusing CPAP.  Patient will call if he changes his mind. RT will continue to monitor
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
## 2014-08-05 NOTE — Progress Notes (Signed)
[facility] is able to accept pt when medically stable for DC- pt will need the sitter to be discharged for 24 hours prior to DC.  Insurance Josem Kaufmann is still pending.  fl2 on chart to be signed.  CSW will continue to follow.  Domenica Reamer, [place] Social Worker [phone]

## 2014-08-06 DIAGNOSIS — Y95 Nosocomial condition: Secondary | ICD-10-CM

## 2014-08-06 DIAGNOSIS — J189 Pneumonia, unspecified organism: Secondary | ICD-10-CM | POA: Insufficient documentation

## 2014-08-06 LAB — COMPREHENSIVE METABOLIC PANEL
ALBUMIN: 2.7 g/dL — AB (ref 3.5–5.0)
ALT: 87 U/L — ABNORMAL HIGH (ref 17–63)
AST: 36 U/L (ref 15–41)
Alkaline Phosphatase: 212 U/L — ABNORMAL HIGH (ref 38–126)
Anion gap: 10 (ref 5–15)
BILIRUBIN TOTAL: 1.6 mg/dL — AB (ref 0.3–1.2)
BUN: 28 mg/dL — ABNORMAL HIGH (ref 6–20)
CHLORIDE: 85 mmol/L — AB (ref 101–111)
CO2: 25 mmol/L (ref 22–32)
CREATININE: 2.31 mg/dL — AB (ref 0.61–1.24)
Calcium: 8.9 mg/dL (ref 8.9–10.3)
GFR calc Af Amer: 34 mL/min — ABNORMAL LOW (ref >60)
GFR calc non Af Amer: 29 mL/min — ABNORMAL LOW (ref >60)
Glucose, Bld: 125 mg/dL — ABNORMAL HIGH (ref 65–99)
POTASSIUM: 3.8 mmol/L (ref 3.5–5.1)
Sodium: 120 mmol/L — ABNORMAL LOW (ref 135–145)
TOTAL PROTEIN: 5.5 g/dL — AB (ref 6.5–8.1)

## 2014-08-06 LAB — CBC
HEMATOCRIT: 32.4 % — AB (ref 39.0–52.0)
HEMOGLOBIN: 11 g/dL — AB (ref 13.0–17.0)
MCH: 35.1 pg — ABNORMAL HIGH (ref 26.0–34.0)
MCHC: 34 g/dL (ref 30.0–36.0)
MCV: 103.5 fL — AB (ref 78.0–100.0)
Platelets: 225 10*3/uL (ref 150–400)
RBC: 3.13 MIL/uL — ABNORMAL LOW (ref 4.22–5.81)
RDW: 14.3 % (ref 11.5–15.5)
WBC: 19.9 10*3/uL — AB (ref 4.0–10.5)

## 2014-08-06 LAB — ALKALINE PHOSPHATASE ISOENZYMES
ALKALINE PHOSPHATASE (APISO): 288 U/L — AB (ref 40–115)
BONE %: 8 % — AB (ref 28–66)
Intestinal %: 1 % (ref 1–24)
Liver %: 64 % (ref 25–69)
Macrohepatic isoenzymes: 27 % — ABNORMAL HIGH

## 2014-08-06 MED ORDER — LEVOFLOXACIN 750 MG PO TABS
750.0000 mg | ORAL_TABLET | ORAL | Status: DC
  Administered 2014-08-07: 750 mg via ORAL
  Filled 2014-08-06: qty 1

## 2014-08-06 MED ORDER — TRAMADOL HCL 50 MG PO TABS
100.0000 mg | ORAL_TABLET | Freq: Once | ORAL | Status: AC
  Administered 2014-08-06: 100 mg via ORAL
  Filled 2014-08-06: qty 2

## 2014-08-06 MED ORDER — VITAMIN B-1 100 MG PO TABS
250.0000 mg | ORAL_TABLET | Freq: Every day | ORAL | Status: DC
Start: 2014-08-06 — End: 2014-08-09
  Administered 2014-08-06 – 2014-08-09 (×4): 250 mg via ORAL
  Filled 2014-08-06 (×4): qty 3

## 2014-08-06 MED ORDER — SODIUM CHLORIDE 0.9 % IV SOLN
INTRAVENOUS | Status: AC
  Administered 2014-08-06: 12:00:00 via INTRAVENOUS

## 2014-08-06 MED ORDER — TAMSULOSIN HCL 0.4 MG PO CAPS
0.4000 mg | ORAL_CAPSULE | Freq: Every day | ORAL | Status: DC
  Administered 2014-08-06 – 2014-08-09 (×4): 0.4 mg via ORAL
  Filled 2014-08-06 (×4): qty 1

## 2014-08-06 NOTE — Progress Notes (Signed)
Patient voided 95cc this shift.  Bladder scan showed 431 cc's urine in bladder. MD paged.
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
## 2014-08-06 NOTE — Progress Notes (Signed)
Added belongings brought by patient's wife to patient's valuables envelope in the ED safe.  Valuables included brown wallet (with $39 cash, 7 credit cards, and miscellaneous cards) and envelope of cash ($345). Valuables envelope sheet in on patient's shadow chart.

## 2014-08-07 DIAGNOSIS — E871 Hypo-osmolality and hyponatremia: Secondary | ICD-10-CM | POA: Insufficient documentation

## 2014-08-07 LAB — COMPREHENSIVE METABOLIC PANEL
ALT: 74 U/L — AB (ref 17–63)
ANION GAP: 7 (ref 5–15)
AST: 36 U/L (ref 15–41)
Albumin: 2.4 g/dL — ABNORMAL LOW (ref 3.5–5.0)
Alkaline Phosphatase: 204 U/L — ABNORMAL HIGH (ref 38–126)
BUN: 20 mg/dL (ref 6–20)
CALCIUM: 8.5 mg/dL — AB (ref 8.9–10.3)
CO2: 25 mmol/L (ref 22–32)
CREATININE: 1.32 mg/dL — AB (ref 0.61–1.24)
Chloride: 88 mmol/L — ABNORMAL LOW (ref 101–111)
GFR, EST NON AFRICAN AMERICAN: 57 mL/min — AB (ref >60)
Glucose, Bld: 86 mg/dL (ref 65–99)
Potassium: 4.1 mmol/L (ref 3.5–5.1)
Sodium: 120 mmol/L — ABNORMAL LOW (ref 135–145)
TOTAL PROTEIN: 5 g/dL — AB (ref 6.5–8.1)
Total Bilirubin: 1.2 mg/dL (ref 0.3–1.2)

## 2014-08-07 LAB — CBC
HCT: 27.5 % — ABNORMAL LOW (ref 39.0–52.0)
Hemoglobin: 9.6 g/dL — ABNORMAL LOW (ref 13.0–17.0)
MCH: 35.8 pg — ABNORMAL HIGH (ref 26.0–34.0)
MCHC: 34.9 g/dL (ref 30.0–36.0)
MCV: 102.6 fL — AB (ref 78.0–100.0)
Platelets: 220 10*3/uL (ref 150–400)
RBC: 2.68 MIL/uL — ABNORMAL LOW (ref 4.22–5.81)
RDW: 13.9 % (ref 11.5–15.5)
WBC: 14 10*3/uL — ABNORMAL HIGH (ref 4.0–10.5)

## 2014-08-07 MED ORDER — SODIUM CHLORIDE 0.9 % IV SOLN
INTRAVENOUS | Status: AC
  Administered 2014-08-07: 12:00:00 via INTRAVENOUS

## 2014-08-07 MED ORDER — LEVOFLOXACIN 750 MG PO TABS
750.0000 mg | ORAL_TABLET | Freq: Every day | ORAL | Status: DC
  Administered 2014-08-08 – 2014-08-09 (×2): 750 mg via ORAL
  Filled 2014-08-07 (×2): qty 1
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
## 2014-08-08 LAB — COMPREHENSIVE METABOLIC PANEL
ALBUMIN: 2.7 g/dL — AB (ref 3.5–5.0)
ALK PHOS: 221 U/L — AB (ref 38–126)
ALT: 61 U/L (ref 17–63)
ANION GAP: 8 (ref 5–15)
AST: 29 U/L (ref 15–41)
BUN: 9 mg/dL (ref 6–20)
CHLORIDE: 88 mmol/L — AB (ref 101–111)
CO2: 27 mmol/L (ref 22–32)
Calcium: 8.9 mg/dL (ref 8.9–10.3)
Creatinine, Ser: 1.15 mg/dL (ref 0.61–1.24)
GFR calc non Af Amer: >60 mL/min (ref >60)
Glucose, Bld: 103 mg/dL — ABNORMAL HIGH (ref 65–99)
POTASSIUM: 3.9 mmol/L (ref 3.5–5.1)
SODIUM: 123 mmol/L — AB (ref 135–145)
Total Bilirubin: 1.3 mg/dL — ABNORMAL HIGH (ref 0.3–1.2)
Total Protein: 5.9 g/dL — ABNORMAL LOW (ref 6.5–8.1)

## 2014-08-08 LAB — CBC
HEMATOCRIT: 30.1 % — AB (ref 39.0–52.0)
HEMOGLOBIN: 10.4 g/dL — AB (ref 13.0–17.0)
MCH: 35 pg — ABNORMAL HIGH (ref 26.0–34.0)
MCHC: 34.6 g/dL (ref 30.0–36.0)
MCV: 101.3 fL — ABNORMAL HIGH (ref 78.0–100.0)
PLATELETS: 287 10*3/uL (ref 150–400)
RBC: 2.97 MIL/uL — AB (ref 4.22–5.81)
RDW: 13.5 % (ref 11.5–15.5)
WBC: 10.4 10*3/uL (ref 4.0–10.5)

## 2014-08-08 LAB — URINE CULTURE: Culture: NO GROWTH
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
## 2014-08-08 NOTE — Progress Notes (Signed)
CSW spoke with Dr. Brita Romp for d/c planning. MD stated that Pt is not medically ready for d/c today. MD stated that Pt may be ready for d/c in the am.   CSW has secured a bed at [facility] (Rm 147D) and CSW sent updated clinicals to facility.   Weekday CSW to follow-up with Pt and facility on 6/20 for further d/c planning.     [facility]  2S, 67M,3S, 5N, 6N [phone]

## 2014-08-09 LAB — COMPREHENSIVE METABOLIC PANEL
ALBUMIN: 2.6 g/dL — AB (ref 3.5–5.0)
ALT: 52 U/L (ref 17–63)
AST: 25 U/L (ref 15–41)
Alkaline Phosphatase: 219 U/L — ABNORMAL HIGH (ref 38–126)
Anion gap: 7 (ref 5–15)
BUN: 9 mg/dL (ref 6–20)
CALCIUM: 8.9 mg/dL (ref 8.9–10.3)
CO2: 26 mmol/L (ref 22–32)
Chloride: 87 mmol/L — ABNORMAL LOW (ref 101–111)
Creatinine, Ser: 1.02 mg/dL (ref 0.61–1.24)
GFR calc non Af Amer: >60 mL/min (ref >60)
Glucose, Bld: 101 mg/dL — ABNORMAL HIGH (ref 65–99)
Potassium: 3.7 mmol/L (ref 3.5–5.1)
Sodium: 120 mmol/L — ABNORMAL LOW (ref 135–145)
Total Bilirubin: 1.1 mg/dL (ref 0.3–1.2)
Total Protein: 5.9 g/dL — ABNORMAL LOW (ref 6.5–8.1)

## 2014-08-09 MED ORDER — FOLIC ACID 1 MG PO TABS: 1.0000 mg | ORAL_TABLET | Freq: Every day | ORAL | Status: AC

## 2014-08-09 MED ORDER — LEVOFLOXACIN 750 MG PO TABS: 750.0000 mg | ORAL_TABLET | Freq: Every day | ORAL | Status: AC

## 2014-08-09 MED ORDER — TAMSULOSIN HCL 0.4 MG PO CAPS: 0.4000 mg | ORAL_CAPSULE | Freq: Every day | ORAL | Status: AC

## 2014-08-09 MED ORDER — HALOPERIDOL 1 MG PO TABS: 1.0000 mg | ORAL_TABLET | Freq: Every day | ORAL | Status: AC

## 2014-08-09 MED ORDER — THIAMINE HCL 100 MG PO TABS: 100.0000 mg | ORAL_TABLET | Freq: Every day | ORAL | Status: AC

## 2014-08-09 NOTE — Progress Notes (Signed)
Chaplain followed up from previous visit. Pt was Being discharged against doctor's advice. Chaplain prayed for Pt.    08/09/14 1100  Clinical Encounter Type  Visited With Patient  Visit Type Spiritual support  Referral From [place]

## 2014-08-09 NOTE — Care Management Note (Signed)
Case Management Note  Patient Details  Name: Ledger Heindl MRN: [id number] Date of Birth: April 03, 1955  Subjective/Objective:                    Action/Plan:  Patient states he does have 24 hour assistance at home . His friend is available. No DME needs  Expected Discharge Date:       08-09-14           Expected Discharge Plan:  [place]  In-House Referral:     Discharge planning Services  CM Consult  Post Acute Care Choice:  Home Health Choice offered to:  Patient  DME Arranged:    DME Agency:     HH Arranged:  PT [place]:  [place]  Status of Service:     Medicare Important Message Given:    Date Medicare IM Given:    Medicare IM give by:    Date Additional Medicare IM Given:    Additional Medicare Important Message give by:     If discussed at [place] of Stay Meetings, dates discussed:    Additional Comments:  Marilu Favre, RN 08/09/2014, 10:23 AM

## 2014-08-09 NOTE — Progress Notes (Signed)
Discharge home. Home discharge instruction given,no question verbalized. SW arrange transportation.

## 2014-08-09 NOTE — Clinical Social Work Note (Signed)
CSW received referral for SNF.  Case discussed with case manager and PT, and plan is to discharge home.  CSW to sign off please re-consult if social work needs arise.  Jones Broom. Mendes, MSW, [place]

## 2014-08-09 NOTE — Discharge Instructions (Signed)
It is important to take your medications as prescribed.  Please see your new medication list.  It is important to follow-up with your primary care doctor.  Physical therapy recommends that you have 24 hour supervision.
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
## 2014-08-10 LAB — CULTURE, BLOOD (ROUTINE X 2)
Culture: NO GROWTH
Culture: NO GROWTH

## 2014-08-20 ENCOUNTER — Telehealth: Payer: Self-pay

## 2014-08-20 NOTE — Telephone Encounter (Signed)
Calling to state that Jack Perez has refused to receive services from [facility] per physician orders.  Can contact Carmen at [place] if further info needed.

## 2015-11-20 DEATH — deceased

## 2017-01-10 IMAGING — CR DG CHEST 1V PORT
1 series · 1 of 1 positions shown · non-contrast
Comparison: None.

CLINICAL DATA: Fatigue

EXAM:
PORTABLE CHEST - 1 VIEW

[AP]
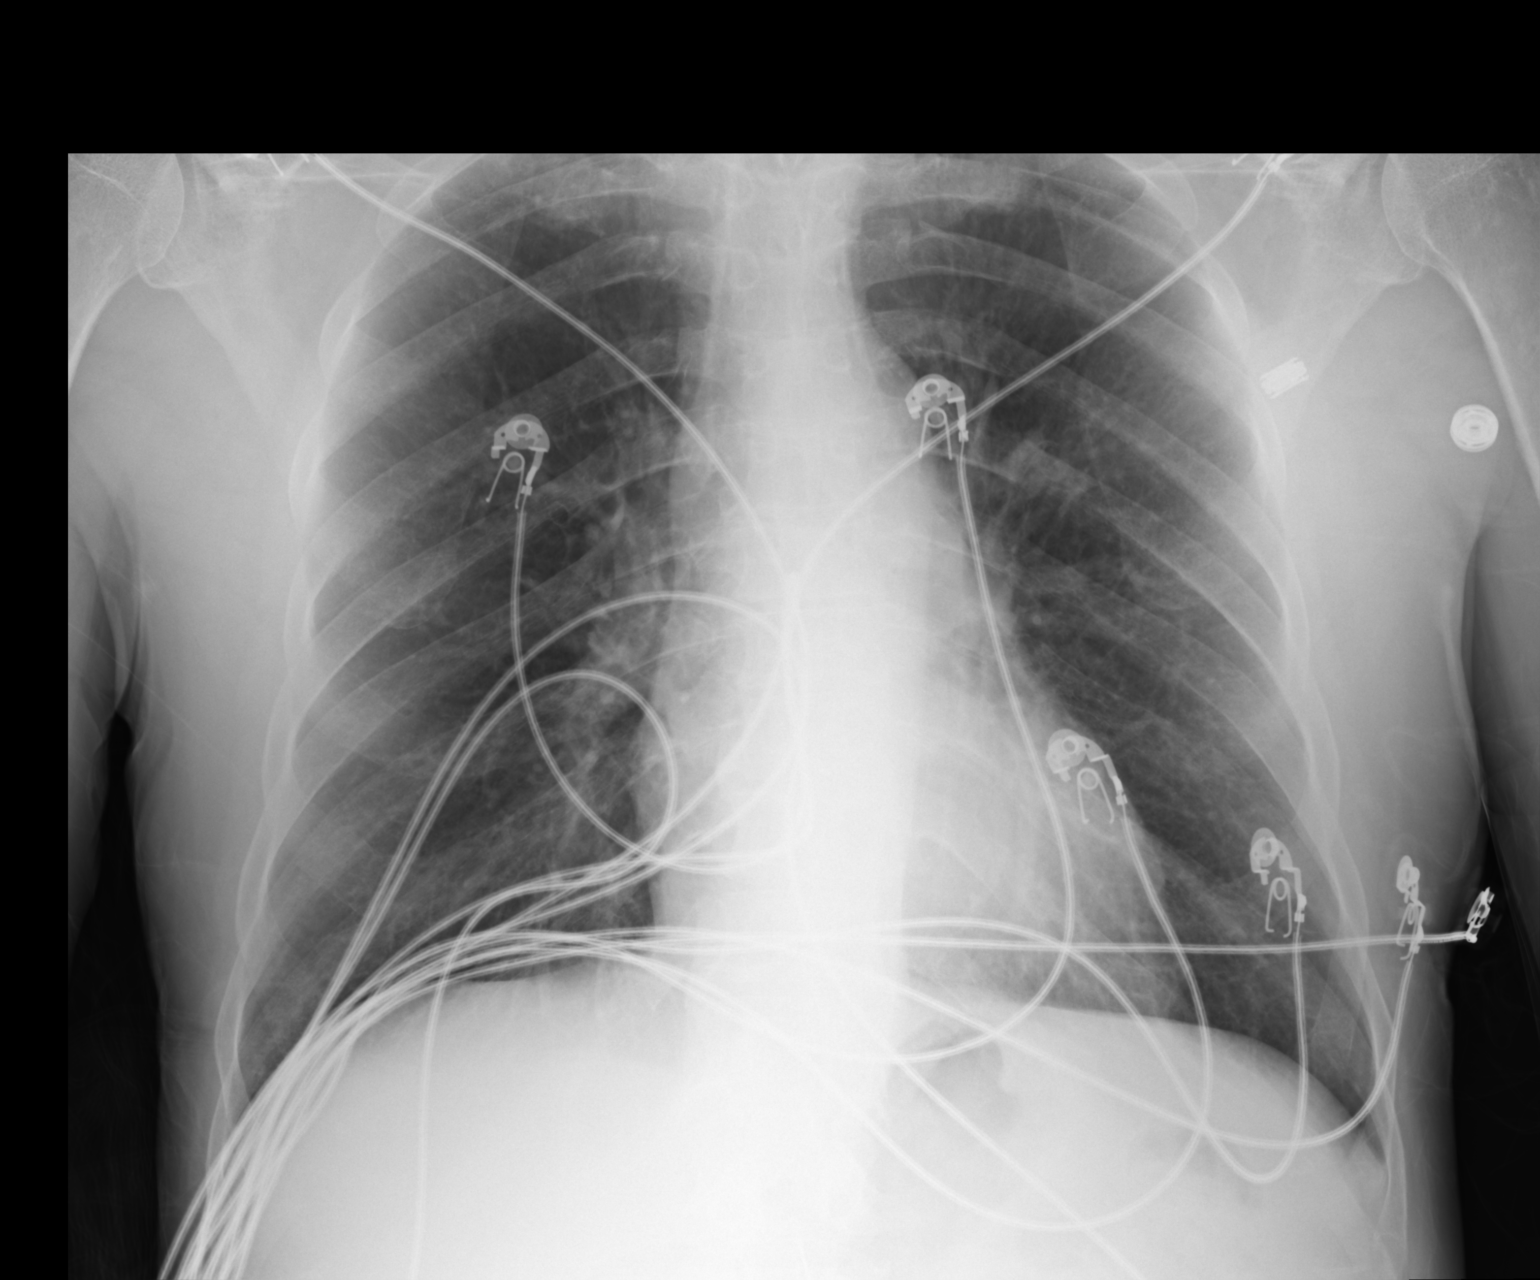

[1 of 1 positions shown; findings below may reference images not displayed]

FINDINGS: The heart size and mediastinal contours are within normal limits.
Both lungs are clear. The visualized skeletal structures are
unremarkable.
IMPRESSION: No active disease.

## 2017-01-16 IMAGING — CT CT HEAD W/O CM
1 series · 15 of 30 positions shown, 19 images · non-contrast
Comparison: July 08, 2006

CLINICAL DATA: Confusion; postconcussion syndrome

EXAM:
CT HEAD WITHOUT CONTRAST
TECHNIQUE: Contiguous axial images were obtained from the base of the skull
through the vertex without intravenous contrast.

[Series 2: head 5.0 h30s · axial · 0.48mm/px · z∈[-109,+36]mm · 15 of 33 slices shown, 19 images]
[im 2/33  brain]
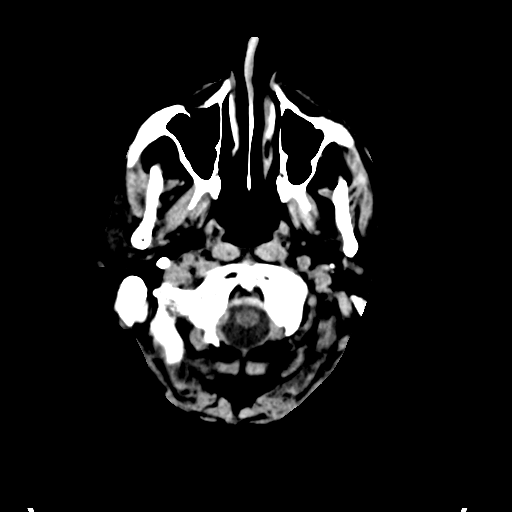
[im 2/33  bone]
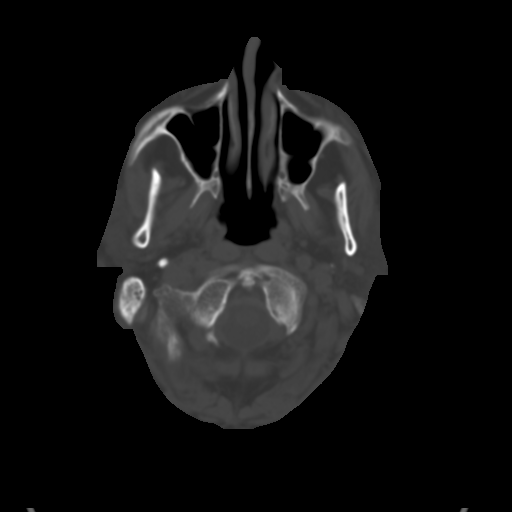
[im 4/33  brain]
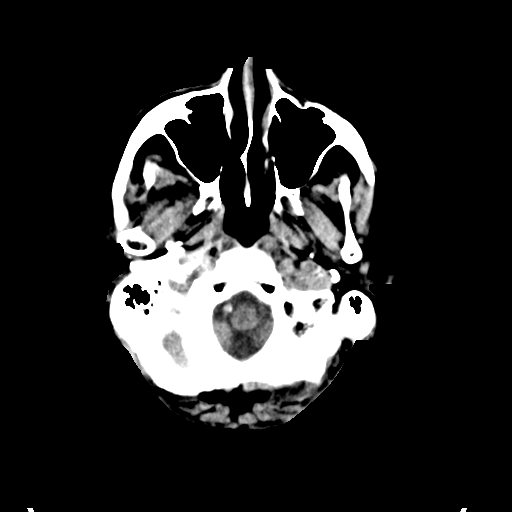
[im 6/33  brain]
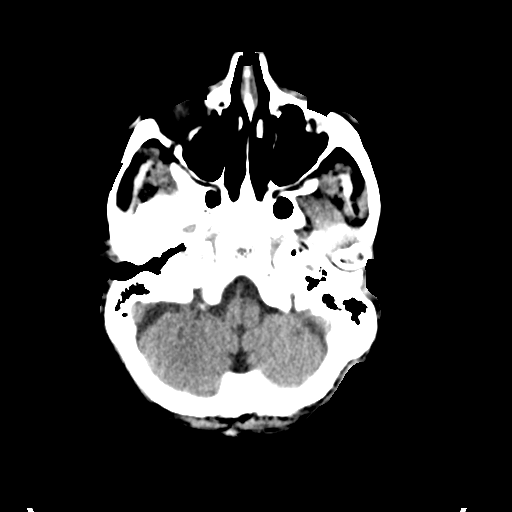
[im 8/33  brain]
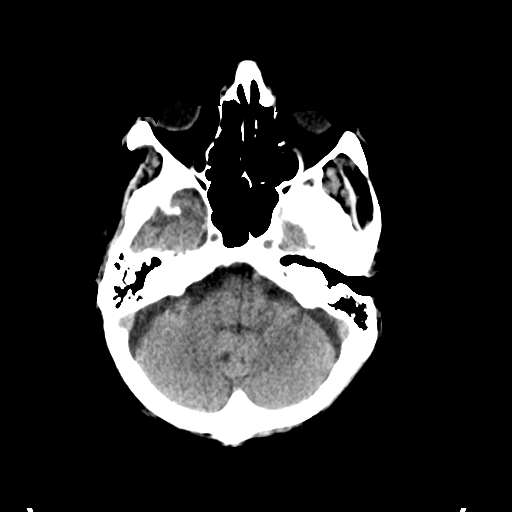
[im 10/33  brain]
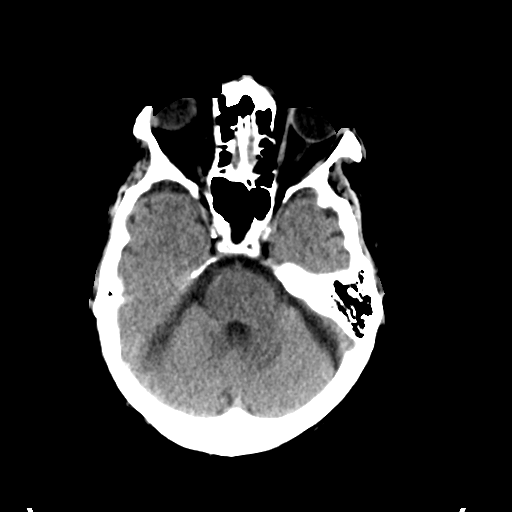
[im 10/33  bone]
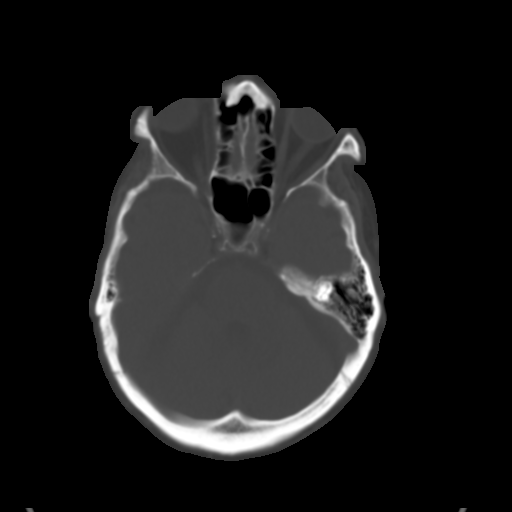
[im 13/33  brain]
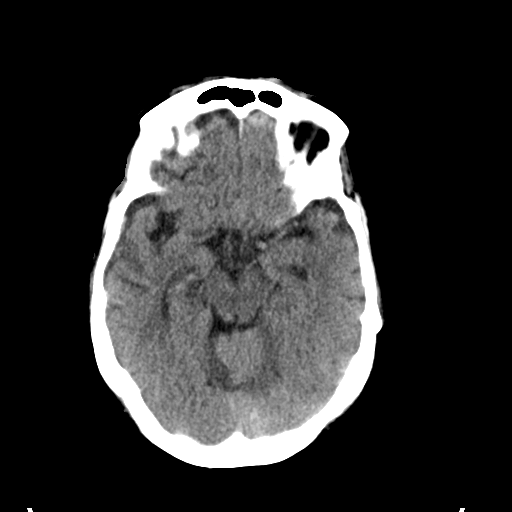
[im 15/33  brain]
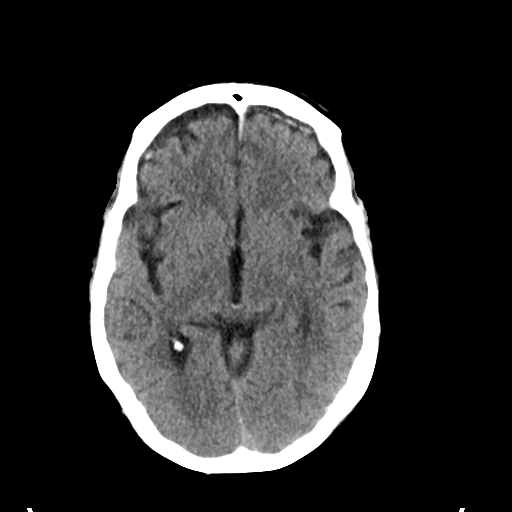
[im 17/33  brain]
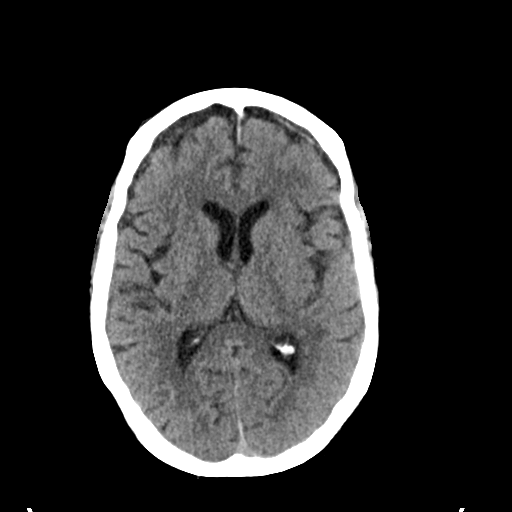
[im 18/33  brain]
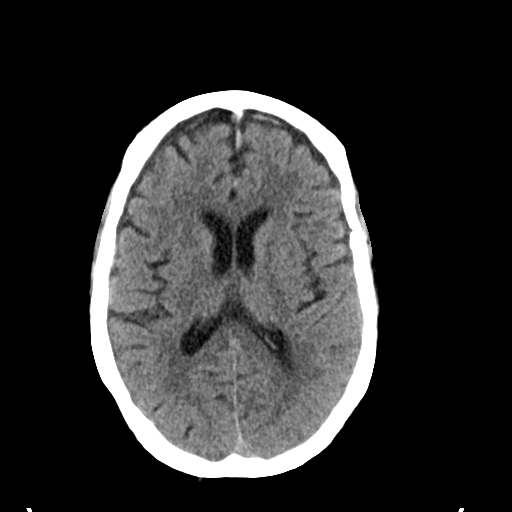
[im 18/33  bone]
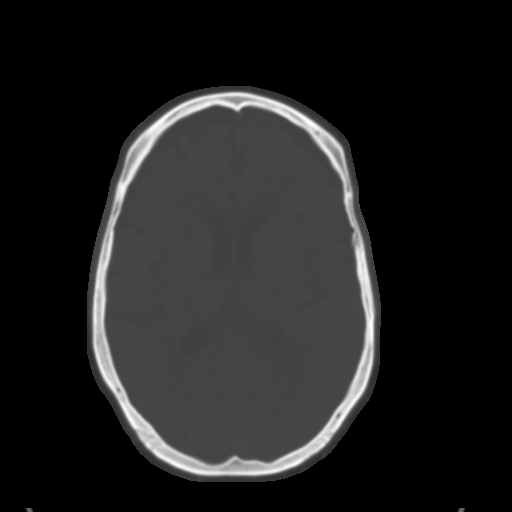
[im 20/33  brain]
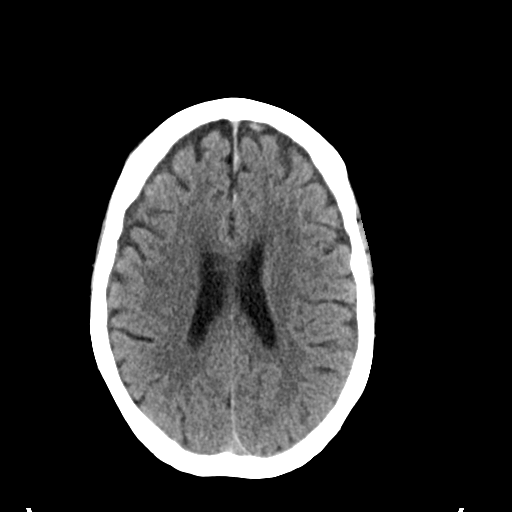
[im 23/33  brain]
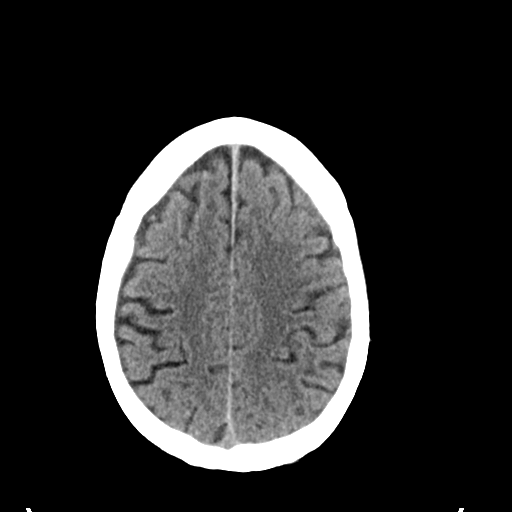
[im 25/33  brain]
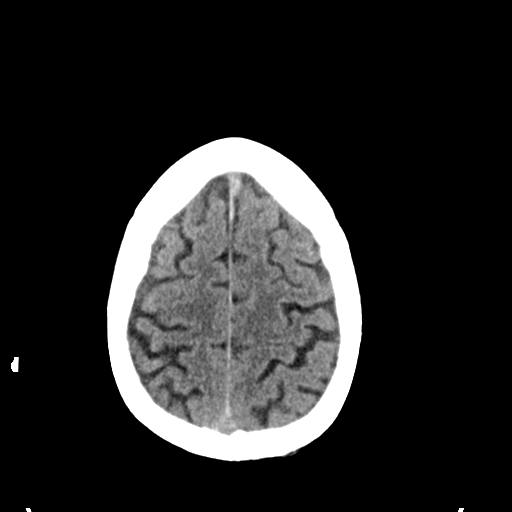
[im 27/33  brain]
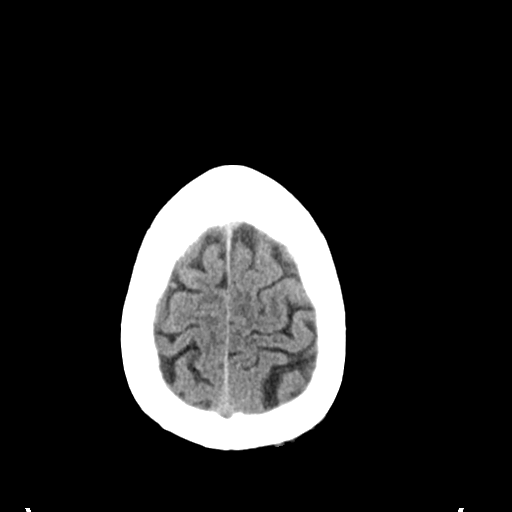
[im 27/33  bone]
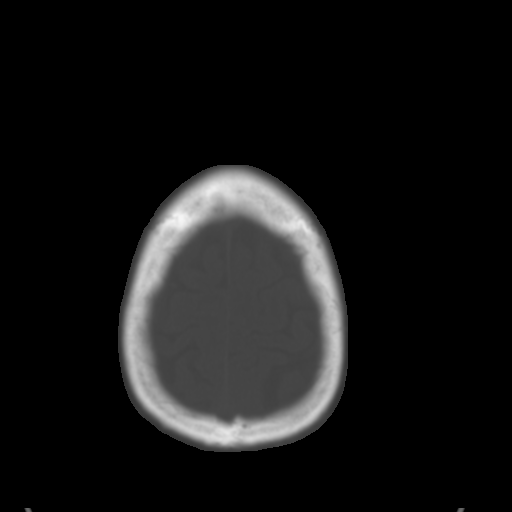
[im 29/33  brain]
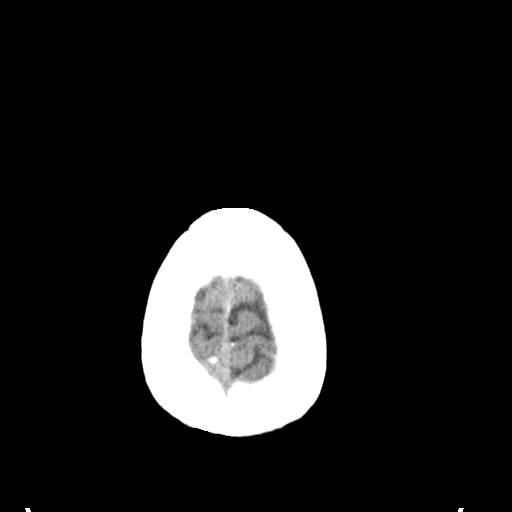
[im 31/33  brain]
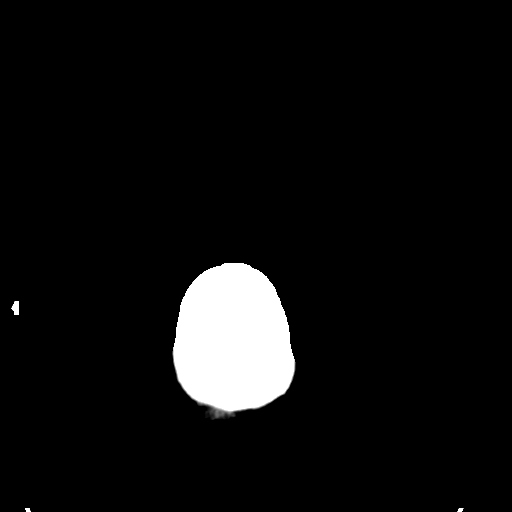

[15 of 30 positions shown; findings below may reference images not displayed]

FINDINGS: The ventricles are normal in size and configuration. There is mild
frontal atrophy bilaterally. In both frontal regions, there are
small hygromas with linear areas of increased attenuation, probably
representing membranes from prior subdural hematomas in this area.
There is some mild irregularity along the anterior falx, likely
representing a small amount of parafalcine subdural hematoma which
may be acute. There is no appreciable mass effect. Intra-axial
hemorrhage, 4 or midline shift. No focal gray-white compartment
lesion to suggest acute infarct is present. Bony calvarium appears
intact. The mastoid air cells are clear.
IMPRESSION: Evidence of a small amount of parafalcine subdural hematoma
anteriorly without appreciable mass effect. Small frontal subdural
hygromas with probable membranes within these small extra-axial
fluid collections, probably not acute. Particular attention to these
areas on subsequent evaluations is warranted, however. There is no
intra-axial hemorrhage. No midline shift. No evidence of acute
infarct.

Critical Value/emergent results were called by telephone at the time
of interpretation on 08/04/2014 at [DATE] to Dr. Alia Tiger ,
who verbally acknowledged these results.
